# Patient Record
Sex: Male | Born: 2000 | Race: Black or African American | Hispanic: No | Marital: Single | Smoking: Never smoker
Health system: Southern US, Community
[De-identification: ages and names within clinical notes are randomized; demographics above are authoritative.]

---
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
## 2022-05-12 ENCOUNTER — Ambulatory Visit: Payer: Self-pay | Admitting: Family Medicine

## 2022-08-13 ENCOUNTER — Ambulatory Visit: Payer: Self-pay | Admitting: Family Medicine

## 2022-09-02 ENCOUNTER — Ambulatory Visit: Payer: Self-pay | Admitting: Family Medicine

## 2023-03-27 ENCOUNTER — Other Ambulatory Visit: Payer: Self-pay

## 2023-03-27 ENCOUNTER — Encounter (HOSPITAL_BASED_OUTPATIENT_CLINIC_OR_DEPARTMENT_OTHER): Payer: Self-pay | Admitting: Radiology

## 2023-03-27 ENCOUNTER — Emergency Department (HOSPITAL_BASED_OUTPATIENT_CLINIC_OR_DEPARTMENT_OTHER): Payer: BC Managed Care – PPO

## 2023-03-27 ENCOUNTER — Emergency Department (HOSPITAL_BASED_OUTPATIENT_CLINIC_OR_DEPARTMENT_OTHER)
Admission: EM | Admit: 2023-03-27 | Discharge: 2023-03-27 | Disposition: A | Discharge status: Home / Self Care | Payer: BC Managed Care – PPO | Attending: Emergency Medicine | Admitting: Emergency Medicine

## 2023-03-27 DIAGNOSIS — R1031 Right lower quadrant pain: Secondary | ICD-10-CM | POA: Insufficient documentation

## 2023-03-27 DIAGNOSIS — R109 Unspecified abdominal pain: Secondary | ICD-10-CM | POA: Diagnosis present

## 2023-03-27 DIAGNOSIS — Z91010 Allergy to peanuts: Secondary | ICD-10-CM | POA: Insufficient documentation

## 2023-03-27 DIAGNOSIS — Z1152 Encounter for screening for COVID-19: Secondary | ICD-10-CM | POA: Diagnosis not present

## 2023-03-27 DIAGNOSIS — J189 Pneumonia, unspecified organism: Secondary | ICD-10-CM

## 2023-03-27 DIAGNOSIS — J168 Pneumonia due to other specified infectious organisms: Secondary | ICD-10-CM | POA: Insufficient documentation

## 2023-03-27 LAB — URINALYSIS, ROUTINE W REFLEX MICROSCOPIC
Bilirubin Urine: NEGATIVE
Glucose, UA: NEGATIVE mg/dL
Hgb urine dipstick: NEGATIVE
Ketones, ur: NEGATIVE mg/dL
Leukocytes,Ua: NEGATIVE
Nitrite: NEGATIVE
Specific Gravity, Urine: 1.03 (ref 1.005–1.030)
pH: 6 (ref 5.0–8.0)

## 2023-03-27 LAB — RESP PANEL BY RT-PCR (RSV, FLU A&B, COVID)  RVPGX2
Influenza A by PCR: NEGATIVE
Influenza B by PCR: NEGATIVE
Resp Syncytial Virus by PCR: NEGATIVE
SARS Coronavirus 2 by RT PCR: NEGATIVE

## 2023-03-27 LAB — COMPREHENSIVE METABOLIC PANEL
ALT: 17 U/L (ref 0–44)
AST: 14 U/L — ABNORMAL LOW (ref 15–41)
Albumin: 4.4 g/dL (ref 3.5–5.0)
Alkaline Phosphatase: 65 U/L (ref 38–126)
Anion gap: 12 (ref 5–15)
BUN: 10 mg/dL (ref 6–20)
CO2: 23 mmol/L (ref 22–32)
Calcium: 9.6 mg/dL (ref 8.9–10.3)
Chloride: 99 mmol/L (ref 98–111)
Creatinine, Ser: 0.98 mg/dL (ref 0.61–1.24)
GFR, Estimated: >60 mL/min (ref >60)
Glucose, Bld: 92 mg/dL (ref 70–99)
Potassium: 3.8 mmol/L (ref 3.5–5.1)
Sodium: 134 mmol/L — ABNORMAL LOW (ref 135–145)
Total Bilirubin: 0.7 mg/dL (ref 0.3–1.2)
Total Protein: 7.7 g/dL (ref 6.5–8.1)

## 2023-03-27 LAB — LIPASE, BLOOD: Lipase: <10 U/L — ABNORMAL LOW (ref 11–51)

## 2023-03-27 LAB — CBC
HCT: 44.1 % (ref 39.0–52.0)
Hemoglobin: 14.5 g/dL (ref 13.0–17.0)
MCH: 25.3 pg — ABNORMAL LOW (ref 26.0–34.0)
MCHC: 32.9 g/dL (ref 30.0–36.0)
MCV: 77.1 fL — ABNORMAL LOW (ref 80.0–100.0)
Platelets: 265 10*3/uL (ref 150–400)
RBC: 5.72 MIL/uL (ref 4.22–5.81)
RDW: 13.2 % (ref 11.5–15.5)
WBC: 8.2 10*3/uL (ref 4.0–10.5)
nRBC: 0 % (ref 0.0–0.2)

## 2023-03-27 MED ORDER — BENZONATATE 100 MG PO CAPS: 100.0000 mg | ORAL_CAPSULE | Freq: Three times a day (TID) | ORAL | 0 refills | Status: AC

## 2023-03-27 MED ORDER — IOHEXOL 300 MG/ML  SOLN
100.0000 mL | Freq: Once | INTRAMUSCULAR | Status: AC | PRN
  Administered 2023-03-27: 80 mL via INTRAVENOUS

## 2023-03-27 MED ORDER — ONDANSETRON HCL 4 MG/2ML IJ SOLN
4.0000 mg | Freq: Once | INTRAMUSCULAR | Status: AC
Start: 2023-03-27 — End: 2023-03-27
  Administered 2023-03-27: 4 mg via INTRAVENOUS
  Filled 2023-03-27: qty 2

## 2023-03-27 MED ORDER — SODIUM CHLORIDE 0.9 % IV BOLUS
1000.0000 mL | Freq: Once | INTRAVENOUS | Status: AC
  Administered 2023-03-27: 1000 mL via INTRAVENOUS

## 2023-03-27 MED ORDER — MORPHINE SULFATE (PF) 4 MG/ML IV SOLN
4.0000 mg | Freq: Once | INTRAVENOUS | Status: AC
  Administered 2023-03-27: 4 mg via INTRAVENOUS
  Filled 2023-03-27: qty 1

## 2023-03-27 MED ORDER — ACETAMINOPHEN 500 MG PO TABS
1000.0000 mg | ORAL_TABLET | Freq: Once | ORAL | Status: AC
  Administered 2023-03-27: 1000 mg via ORAL
  Filled 2023-03-27: qty 2

## 2023-03-27 MED ORDER — AMOXICILLIN 500 MG PO CAPS: 1000.0000 mg | ORAL_CAPSULE | Freq: Two times a day (BID) | ORAL | 0 refills | Status: AC

## 2023-03-27 MED ORDER — DOXYCYCLINE HYCLATE 100 MG PO CAPS: 100.0000 mg | ORAL_CAPSULE | Freq: Two times a day (BID) | ORAL | 0 refills | Status: AC

## 2023-03-27 NOTE — ED Triage Notes (Signed)
RLQ pain. Starting this AM. -N/-V. Has been feeling fatigued, coughing, and 'hot'- not checking temps at home. Complains of some right eye irritation as well for past 2 days.

## 2023-03-27 NOTE — ED Provider Notes (Signed)
[place] EMERGENCY DEPARTMENT AT [facility] Provider Note   CSN: [id number] Arrival date & time: 03/27/23  1533     History  Chief Complaint  Patient presents with   Abdominal Pain    FAHD ANTCZAK is a 22 y.o. male.  22 yo M with a chief complaints of right-sided abdominal pain.  He thinks he woke up with this about 2 AM.  Has felt generally unwell since then.  Have been suffering from fevers and chills for a couple days actually.  Worse with movement twisting palpation.   Abdominal Pain      Home Medications Prior to Admission medications   Medication Sig Start Date End Date Taking? Authorizing Provider  amoxicillin (AMOXIL) 500 MG capsule Take 2 capsules (1,000 mg total) by mouth 2 (two) times daily. 03/27/23  Yes Melene Plan, DO  benzonatate (TESSALON) 100 MG capsule Take 1 capsule (100 mg total) by mouth every 8 (eight) hours. 03/27/23  Yes Melene Plan, DO  doxycycline (VIBRAMYCIN) 100 MG capsule Take 1 capsule (100 mg total) by mouth 2 (two) times daily. One po bid x 7 days 03/27/23  Yes Melene Plan, DO  hydrocortisone 2.5 % cream APPLY TOPICALLY TO AFFECTED AREAS ON FACE DAILY FOR 2 WEEKS, THEN STOP. MAY REPEAT AS NEEDED. 03/27/16   [provider]  ketoconazole (NIZORAL) 2 % cream APPLY TOPICALLY TO FACE DAILY FOR 2 WEEKS, THEN APPLY EVERY OTHER DAY AS NEEDED. 03/27/16   [provider]  nystatin cream (MYCOSTATIN) APPLY TO AFFECTED AREA TWICE A DAY 03/03/16   [provider]  tacrolimus (PROTOPIC) 0.1 % ointment Apply topically 2 (two) times daily. 05/05/16   Baxter Hire, MD      Allergies    Fish allergy, Other, Peanut-containing drug products, and Shellfish allergy    Review of Systems   Review of Systems  Gastrointestinal:  Positive for abdominal pain.    Physical Exam Updated Vital Signs BP (!) 102/59   Pulse 79   Temp (!) 101 F (38.3 C) (Oral)   Resp 18   SpO2 95%  Physical Exam Vitals and nursing note reviewed.   Constitutional:      Appearance: He is well-developed.  HENT:     Head: Normocephalic and atraumatic.  Eyes:     Pupils: Pupils are equal, round, and reactive to light.  Neck:     Vascular: No JVD.  Cardiovascular:     Rate and Rhythm: Normal rate and regular rhythm.     Heart sounds: No murmur heard.    No friction rub. No gallop.  Pulmonary:     Effort: No respiratory distress.     Breath sounds: No wheezing.  Abdominal:     General: There is no distension.     Tenderness: There is no abdominal tenderness. There is no guarding or rebound.     Comments: Fairly benign abdominal exam but does say that he has some discomfort in the right lower quadrant with deep palpation.  No obvious pain in the right upper quadrant.  Negative Murphy sign.  Musculoskeletal:        General: Normal range of motion.     Cervical back: Normal range of motion and neck supple.  Skin:    Coloration: Skin is not pale.     Findings: No rash.  Neurological:     Mental Status: He is alert and oriented to person, place, and time.  Psychiatric:        Behavior: Behavior  normal.     ED Results / Procedures / Treatments   Labs (all labs ordered are listed, but only abnormal results are displayed) Labs Reviewed  LIPASE, BLOOD - Abnormal; Notable for the following components:      Result Value   Lipase <10 (*)    All other components within normal limits  COMPREHENSIVE METABOLIC PANEL - Abnormal; Notable for the following components:   Sodium 134 (*)    AST 14 (*)    All other components within normal limits  CBC - Abnormal; Notable for the following components:   MCV 77.1 (*)    MCH 25.3 (*)    All other components within normal limits  URINALYSIS, ROUTINE W REFLEX MICROSCOPIC - Abnormal; Notable for the following components:   Protein, ur TRACE (*)    All other components within normal limits  RESP PANEL BY RT-PCR (RSV, FLU A&B, COVID)  RVPGX2    EKG None  Radiology CT ABDOMEN PELVIS W  CONTRAST  Result Date: 03/27/2023 CLINICAL DATA:  Right lower quadrant abdominal pain EXAM: CT ABDOMEN AND PELVIS WITH CONTRAST TECHNIQUE: Multidetector CT imaging of the abdomen and pelvis was performed using the standard protocol following bolus administration of intravenous contrast. RADIATION DOSE REDUCTION: This exam was performed according to the departmental dose-optimization program which includes automated exposure control, adjustment of the mA and/or kV according to patient size and/or use of iterative reconstruction technique. CONTRAST:  80mL OMNIPAQUE IOHEXOL 300 MG/ML  SOLN COMPARISON:  None Available. FINDINGS: Lower chest: There is a small amount of airspace consolidation in the right middle lobe Hepatobiliary: No focal liver abnormality is seen. No gallstones, gallbladder wall thickening, or biliary dilatation. Pancreas: Unremarkable. No pancreatic ductal dilatation or surrounding inflammatory changes. Spleen: Normal in size without focal abnormality. Adrenals/Urinary Tract: Adrenal glands are unremarkable. Kidneys are normal, without renal calculi, focal lesion, or hydronephrosis. Bladder is unremarkable. Stomach/Bowel: Stomach is within normal limits. Appendix appears normal. No evidence of bowel wall thickening, distention, or inflammatory changes. Vascular/Lymphatic: No significant vascular findings are present. No enlarged abdominal or pelvic lymph nodes. Reproductive: Prostate is unremarkable. Other: No abdominal wall hernia or abnormality. No abdominopelvic ascites. Musculoskeletal: No acute or significant osseous findings. IMPRESSION: 1. Small amount of airspace consolidation in the right middle lobe is suspicious for pneumonia. Follow-up PA and lateral chest x-ray recommended to ensure complete resolution. 2. No evidence for acute appendicitis. 3. No evidence of acute abnormalities in the abdomen or pelvis. Electronically Signed   By: Darliss Cheney M.D.   On: 03/27/2023 18:33     Procedures Procedures    Medications Ordered in ED Medications  sodium chloride 0.9 % bolus 1,000 mL (1,000 mLs Intravenous New Bag/Given 03/27/23 1742)  morphine (PF) 4 MG/ML injection 4 mg (4 mg Intravenous Given 03/27/23 1749)  ondansetron (ZOFRAN) injection 4 mg (4 mg Intravenous Given 03/27/23 1748)  acetaminophen (TYLENOL) tablet 1,000 mg (1,000 mg Oral Given 03/27/23 1748)  iohexol (OMNIPAQUE) 300 MG/ML solution 100 mL (80 mLs Intravenous Contrast Given 03/27/23 1807)    ED Course/ Medical Decision Making/ A&P                             Medical Decision Making Amount and/or Complexity of Data Reviewed Labs: ordered. Radiology: ordered.  Risk OTC drugs. Prescription drug management.   22 yo M with a chief complaints of not feeling well and then developing right-sided abdominal discomfort.  He points slightly above McBurney's  point of area of most discomfort after abdominal exam.  He is febrile to 101 here.  No leukocytosis, LFTs and lipase are unremarkable.  UA negative for infection.  Will obtain a CT scan to assess for possible appendicitis.  CT scan with right lower lobe pneumonia.  Will start on antibiotics.  PCP follow-up.  7:13 PM:  I have discussed the diagnosis/risks/treatment options with the patient and family.  Evaluation and diagnostic testing in the emergency department does not suggest an emergent condition requiring admission or immediate intervention beyond what has been performed at this time.  They will follow up with PCP. We also discussed returning to the ED immediately if new or worsening sx occur. We discussed the sx which are most concerning (e.g., sudden worsening pain, fever, inability to tolerate by mouth) that necessitate immediate return. Medications administered to the patient during their visit and any new prescriptions provided to the patient are listed below.  Medications given during this visit Medications  sodium chloride 0.9 % bolus 1,000 mL  (1,000 mLs Intravenous New Bag/Given 03/27/23 1742)  morphine (PF) 4 MG/ML injection 4 mg (4 mg Intravenous Given 03/27/23 1749)  ondansetron (ZOFRAN) injection 4 mg (4 mg Intravenous Given 03/27/23 1748)  acetaminophen (TYLENOL) tablet 1,000 mg (1,000 mg Oral Given 03/27/23 1748)  iohexol (OMNIPAQUE) 300 MG/ML solution 100 mL (80 mLs Intravenous Contrast Given 03/27/23 1807)     The patient appears reasonably screen and/or stabilized for discharge and I doubt any other medical condition or other [facility] requiring further screening, evaluation, or treatment in the ED at this time prior to discharge.          Final Clinical Impression(s) / ED Diagnoses Final diagnoses:  Pneumonia of right lower lobe due to infectious organism    Rx / DC Orders ED Discharge Orders          Ordered    amoxicillin (AMOXIL) 500 MG capsule  2 times daily        03/27/23 1854    doxycycline (VIBRAMYCIN) 100 MG capsule  2 times daily        03/27/23 1854    benzonatate (TESSALON) 100 MG capsule  Every 8 hours        03/27/23 1855              Melene Plan, DO 03/27/23 1914

## 2023-03-27 NOTE — ED Notes (Signed)
Patient transported to CT 

## 2023-03-27 NOTE — ED Notes (Signed)
Reviewed AVS/discharge instruction with patient. Time allotted for and all questions answered. Patient is agreeable for d/c and escorted to ed exit by staff.  

## 2023-03-27 NOTE — Discharge Instructions (Signed)
You have pneumonia on your CT scan.  We are going to treat you with oral antibiotics.  Please follow-up with your family doctor in the office.  Take tylenol 2 pills 4 times a day and motrin 4 pills 3 times a day.  Drink plenty of fluids.  Return for worsening shortness of breath, headache, confusion. Follow up with your family doctor.

## 2023-03-28 ENCOUNTER — Ambulatory Visit: Payer: Self-pay

## 2024-05-05 ENCOUNTER — Encounter: Payer: Self-pay | Admitting: Emergency Medicine

## 2024-05-05 ENCOUNTER — Ambulatory Visit
Admission: RE | Admit: 2024-05-05 | Discharge: 2024-05-05 | Disposition: A | Discharge status: Home / Self Care | Source: Ambulatory Visit

## 2024-05-05 DIAGNOSIS — L209 Atopic dermatitis, unspecified: Secondary | ICD-10-CM

## 2024-05-05 MED ORDER — TRIAMCINOLONE ACETONIDE 0.1 % EX OINT: 1.0000 | TOPICAL_OINTMENT | Freq: Two times a day (BID) | CUTANEOUS | 2 refills | Status: AC

## 2024-05-05 NOTE — Discharge Instructions (Signed)
  1. Atopic dermatitis of face (Primary) - triamcinolone ointment (KENALOG) 0.1 %; Apply 1 Application topically 2 (two) times daily.  Dispense: 30 g; Refill: 2 - Do not clean affected skin with harsh facial cleansers as this could cause increased skin irritation and inflammation. -Continue to monitor symptoms for any change in severity if there is any escalation of current symptoms or development of new symptoms such as painful rash, purulent drainage from rash, swelling, redness, warmth, fever follow-up for further evaluation and treatment

## 2024-05-05 NOTE — ED Provider Notes (Signed)
 [facility]  Note:  This document was prepared using Conservation officer, historic buildings and may include unintentional dictation errors.  MRN: [id number] DOB: 03/05/2001  Subjective:   Jose Forbes is a 23 y.o. male presenting for irritated skin rash around mouth x 3 to 4 weeks.  Patient reports past history of eczema with previous treatment with topical steroids but has not used any topical steroids this time for treatment.  Patient denies any itching or pain with rash.  Denies any known exposure to any new allergens or change in skin care products.  Patient denies any fever or any other secondary symptoms.  No current facility-administered medications for this encounter.  Current Outpatient Medications:    amoxicillin  (AMOXIL ) 500 MG capsule, Take 2 capsules (1,000 mg total) by mouth 2 (two) times daily., Disp: 40 capsule, Rfl: 0   benzonatate  (TESSALON ) 100 MG capsule, Take 1 capsule (100 mg total) by mouth every 8 (eight) hours., Disp: 21 capsule, Rfl: 0   clobetasol ointment (TEMOVATE) 0.05 %, Apply 1 Application topically 2 (two) times daily., Disp: , Rfl:    doxycycline  (VIBRAMYCIN ) 100 MG capsule, Take 1 capsule (100 mg total) by mouth 2 (two) times daily. One po bid x 7 days, Disp: 14 capsule, Rfl: 0   hydrocortisone 2.5 % cream, APPLY TOPICALLY TO AFFECTED AREAS ON FACE DAILY FOR 2 WEEKS, THEN STOP. MAY REPEAT AS NEEDED., Disp: , Rfl: 1   hydrocortisone 2.5 % ointment, Apply 1 Application topically 2 (two) times daily., Disp: , Rfl:    ketoconazole (NIZORAL) 2 % cream, APPLY TOPICALLY TO FACE DAILY FOR 2 WEEKS, THEN APPLY EVERY OTHER DAY AS NEEDED., Disp: , Rfl: 1   nystatin cream (MYCOSTATIN), APPLY TO AFFECTED AREA TWICE A DAY, Disp: , Rfl: 1   tacrolimus  (PROTOPIC ) 0.1 % ointment, Apply topically 2 (two) times daily., Disp: 100 g, Rfl: 0   triamcinolone cream (KENALOG) 0.1 %, Apply 1 Application topically 2 (two) times daily., Disp: , Rfl:    triamcinolone ointment  (KENALOG) 0.1 %, Apply 1 Application topically 2 (two) times daily., Disp: 30 g, Rfl: 2   Allergies  Allergen Reactions   Fish Allergy    Other     Mold    Peanut-Containing Drug Products    Shellfish Allergy     History reviewed. No pertinent past medical history.   History reviewed. No pertinent surgical history.  History reviewed. No pertinent family history.  Social History   Tobacco Use   Smoking status: Never    Passive exposure: Never   Smokeless tobacco: Never  Vaping Use   Vaping status: Never Used    ROS Refer to HPI for ROS details.  Objective:   Vitals: BP 121/71 (BP Location: Left Arm)   Pulse (!) 50   Temp 98.7 F (37.1 C) (Oral)   Resp 16   Ht 5' 11 (1.803 m)   Wt 132 lb 11.5 oz (60.2 kg)   SpO2 97%   BMI 18.51 kg/m   Physical Exam Vitals and nursing note reviewed.  Constitutional:      General: He is not in acute distress.    Appearance: Normal appearance. He is not ill-appearing or toxic-appearing.  HENT:     Head: Normocephalic.   Cardiovascular:     Rate and Rhythm: Normal rate.  Pulmonary:     Effort: Pulmonary effort is normal. No respiratory distress.   Skin:    General: Skin is warm and dry.     Capillary  Refill: Capillary refill takes less than 2 seconds.     Findings: Rash present. No abrasion, ecchymosis or erythema. Rash is macular.   Neurological:     General: No focal deficit present.     Mental Status: He is alert and oriented to person, place, and time.   Psychiatric:        Mood and Affect: Mood normal.        Behavior: Behavior normal.     Procedures  No results found for this or any previous visit (from the past 24 hours).  No results found.   Assessment and Plan :     Discharge Instructions       1. Atopic dermatitis of face (Primary) - triamcinolone ointment (KENALOG) 0.1 %; Apply 1 Application topically 2 (two) times daily.  Dispense: 30 g; Refill: 2 - Do not clean affected skin with harsh  facial cleansers as this could cause increased skin irritation and inflammation. -Continue to monitor symptoms for any change in severity if there is any escalation of current symptoms or development of new symptoms such as painful rash, purulent drainage from rash, swelling, redness, warmth, fever follow-up for further evaluation and treatment       Aaminah Forrester B Allysa Governale   Charlis Harner B, NP 05/05/24 1819

## 2024-05-05 NOTE — ED Triage Notes (Signed)
 Pt presents c/o rash at the corners of his mouth x 3 to 4 weeks. Pt denies coming in contact with any known allergens.

## 2024-11-18 ENCOUNTER — Other Ambulatory Visit: Payer: Self-pay

## 2024-11-18 DIAGNOSIS — Z91010 Allergy to peanuts: Secondary | ICD-10-CM | POA: Diagnosis not present

## 2024-11-18 DIAGNOSIS — R059 Cough, unspecified: Secondary | ICD-10-CM | POA: Diagnosis present

## 2024-11-18 DIAGNOSIS — J101 Influenza due to other identified influenza virus with other respiratory manifestations: Secondary | ICD-10-CM | POA: Insufficient documentation

## 2024-11-18 LAB — RESP PANEL BY RT-PCR (RSV, FLU A&B, COVID)  RVPGX2
Influenza A by PCR: POSITIVE — AB
Influenza B by PCR: NEGATIVE
Resp Syncytial Virus by PCR: NEGATIVE
SARS Coronavirus 2 by RT PCR: NEGATIVE

## 2024-11-18 MED ORDER — IBUPROFEN 800 MG PO TABS
800.0000 mg | ORAL_TABLET | Freq: Once | ORAL | Status: AC
  Administered 2024-11-18: 800 mg via ORAL
  Filled 2024-11-18: qty 1

## 2024-11-18 NOTE — ED Triage Notes (Signed)
 Pt reports cough, congestion, chills, and body aches x 3 days.

## 2024-11-19 ENCOUNTER — Emergency Department (HOSPITAL_BASED_OUTPATIENT_CLINIC_OR_DEPARTMENT_OTHER)
Admission: EM | Admit: 2024-11-19 | Discharge: 2024-11-19 | Disposition: A | Discharge status: Home / Self Care | Attending: Emergency Medicine | Admitting: Emergency Medicine

## 2024-11-19 DIAGNOSIS — J101 Influenza due to other identified influenza virus with other respiratory manifestations: Secondary | ICD-10-CM

## 2024-11-19 NOTE — ED Provider Notes (Signed)
 " [place] EMERGENCY DEPARTMENT AT [facility] Provider Note   CSN: [id number] Arrival date & time: 11/18/24  8040     Patient presents with: Cough   Jose Forbes is a 23 y.o. male.    Cough    23 year old male presenting to the emergency department with roughly 3 days of flulike illness with cough, congestion, chills and bodyaches.  He has a positive sick contact who tested positive for the flu earlier this week.  He has been tolerating oral intake.  Denies any vomiting.  He has been taking Tylenol  500 mg and 650 mg at home.  Prior to Admission medications  Medication Sig Start Date End Date Taking? Authorizing Provider  amoxicillin  (AMOXIL ) 500 MG capsule Take 2 capsules (1,000 mg total) by mouth 2 (two) times daily. 03/27/23   Emil Share, DO  benzonatate  (TESSALON ) 100 MG capsule Take 1 capsule (100 mg total) by mouth every 8 (eight) hours. 03/27/23   Emil Share, DO  clobetasol ointment (TEMOVATE) 0.05 % Apply 1 Application topically 2 (two) times daily. 07/16/22   [provider]  doxycycline  (VIBRAMYCIN ) 100 MG capsule Take 1 capsule (100 mg total) by mouth 2 (two) times daily. One po bid x 7 days 03/27/23   Floyd, Dan, DO  hydrocortisone 2.5 % cream APPLY TOPICALLY TO AFFECTED AREAS ON FACE DAILY FOR 2 WEEKS, THEN STOP. MAY REPEAT AS NEEDED. 03/27/16   [provider]  hydrocortisone 2.5 % ointment Apply 1 Application topically 2 (two) times daily. 11/01/18   [provider]  ketoconazole (NIZORAL) 2 % cream APPLY TOPICALLY TO FACE DAILY FOR 2 WEEKS, THEN APPLY EVERY OTHER DAY AS NEEDED. 03/27/16   [provider]  nystatin cream (MYCOSTATIN) APPLY TO AFFECTED AREA TWICE A DAY 03/03/16   [provider]  tacrolimus  (PROTOPIC ) 0.1 % ointment Apply topically 2 (two) times daily. 05/05/16   Hicks, Roselyn M, MD  triamcinolone  cream (KENALOG ) 0.1 % Apply 1 Application topically 2 (two) times daily. 01/17/20   [provider]   triamcinolone  ointment (KENALOG ) 0.1 % Apply 1 Application topically 2 (two) times daily. 05/05/24   Reddick, Johnathan B, NP    Allergies: Fish allergy, Other, Peanut-containing drug products, and Shellfish allergy    Review of Systems  Respiratory:  Positive for cough.   All other systems reviewed and are negative.   Updated Vital Signs BP 115/71   Pulse 94   Temp 99.1 F (37.3 C) (Oral)   Resp 16   SpO2 99%   Physical Exam Vitals and nursing note reviewed.  Constitutional:      General: He is not in acute distress. HENT:     Head: Normocephalic and atraumatic.     Mouth/Throat:     Pharynx: Posterior oropharyngeal erythema present. No oropharyngeal exudate.  Eyes:     Conjunctiva/sclera: Conjunctivae normal.     Pupils: Pupils are equal, round, and reactive to light.  Cardiovascular:     Rate and Rhythm: Normal rate and regular rhythm.  Pulmonary:     Effort: Pulmonary effort is normal. No respiratory distress.     Breath sounds: Normal breath sounds.  Abdominal:     General: There is no distension.     Tenderness: There is no abdominal tenderness. There is no guarding.  Musculoskeletal:        General: No deformity or signs of injury.     Cervical back: Neck supple.  Skin:    Findings: No lesion or rash.  Neurological:     General: No focal deficit present.     Mental Status: He is alert. Mental status is at baseline.     (all labs ordered are listed, but only abnormal results are displayed) Labs Reviewed  RESP PANEL BY RT-PCR (RSV, FLU A&B, COVID)  RVPGX2 - Abnormal; Notable for the following components:      Result Value   Influenza A by PCR POSITIVE (*)    All other components within normal limits    EKG: None  Radiology: No results found.   Procedures   Medications Ordered in the ED  ibuprofen  (ADVIL ) tablet 800 mg (800 mg Oral Given 11/18/24 2032)    Clinical Course as of 11/19/24 0244  Sat Nov 19, 2024  0125 Influenza A By PCR(!):  POSITIVE [JL]    Clinical Course User Index [JL] Jerrol Agent, MD                                 Medical Decision Making Amount and/or Complexity of Data Reviewed Labs:  Decision-making details documented in ED Course.  Risk Prescription drug management.    23 year old male presenting to the emergency department with roughly 3 days of flulike illness with cough, congestion, chills and bodyaches.  He has a positive sick contact who tested positive for the flu earlier this week.  He has been tolerating oral intake.  Denies any vomiting.  He has been taking Tylenol  500 mg and 650 mg at home.  On arrival, the patient was afebrile, hemodynamically stable.  Presenting with roughly 3 days of a flulike illness.  My primary concern is for influenza.  PCR testing was collected from triage and resulted positive for influenza A, negative for COVID.  The patient is tolerating p.o. intake and overall well-appearing and vitally stable.  Patient outside the window for Tamiflu at this time, advised alternating Tylenol  and Motrin  for pain control and continued oral rehydration at home, return precautions provided.  Patient overall stable for discharge and outpatient follow-up.     Final diagnoses:  Influenza A    ED Discharge Orders     None          Jerrol Agent, MD 11/19/24 0244  "

## 2024-11-19 NOTE — Discharge Instructions (Addendum)
 You tested positive for influenza.  Recommend continued oral rehydration at home.  Alternate between Tylenol  and ibuprofen , 1 g of Tylenol  every 6 hours and then 3 hours after Tylenol  administration take 600-800 mg of ibuprofen , alternating between the two for total of no more than 4 g of Tylenol  in a day.  Return for any severe worsening symptoms, concern for any inability to tolerate oral intake which would result in dehydration.
